# Patient Record
Sex: Female | Born: 2005 | Race: Black or African American | Hispanic: No | Marital: Single | Smoking: Never smoker
Health system: Southern US, Community
[De-identification: ages and names within clinical notes are randomized; demographics above are authoritative.]

## PROBLEM LIST (undated history)

## (undated) DIAGNOSIS — F419 Anxiety disorder, unspecified: Secondary | ICD-10-CM

## (undated) HISTORY — PX: OTHER SURGICAL HISTORY: SHX169

---
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
## 2024-03-09 ENCOUNTER — Encounter: Payer: Self-pay | Admitting: Emergency Medicine

## 2024-03-09 ENCOUNTER — Other Ambulatory Visit: Payer: Self-pay

## 2024-03-09 ENCOUNTER — Ambulatory Visit
Admission: EM | Admit: 2024-03-09 | Discharge: 2024-03-09 | Disposition: A | Discharge status: Home / Self Care | Source: Home / Self Care | Attending: Family Medicine | Admitting: Family Medicine

## 2024-03-09 DIAGNOSIS — J069 Acute upper respiratory infection, unspecified: Secondary | ICD-10-CM | POA: Diagnosis not present

## 2024-03-09 LAB — POC COVID19/FLU A&B COMBO
Covid Antigen, POC: NEGATIVE
Influenza A Antigen, POC: NEGATIVE
Influenza B Antigen, POC: NEGATIVE

## 2024-03-09 MED ORDER — ONDANSETRON 4 MG PO TBDP: 4.0000 mg | ORAL_TABLET | Freq: Three times a day (TID) | ORAL | 0 refills | Status: AC | PRN

## 2024-03-09 MED ORDER — BENZONATATE 100 MG PO CAPS: 100.0000 mg | ORAL_CAPSULE | Freq: Three times a day (TID) | ORAL | 0 refills | Status: AC | PRN

## 2024-03-09 NOTE — Discharge Instructions (Signed)
 The testing for flu and COVID was negative.  This is most likely some other viral illness causing your symptoms.  Take benzonatate  100 mg, 1 tab every 8 hours as needed for cough.  Ondansetron  dissolved in the mouth every 8 hours as needed for nausea or vomiting. Clear liquids(water, gatorade/pedialyte, ginger ale/sprite, chicken broth/soup) and bland things(crackers/toast, rice, potato, bananas) to eat. Avoid acidic foods like lemon/lime/orange/tomato, and avoid greasy/spicy foods.

## 2024-03-09 NOTE — ED Provider Notes (Signed)
 [facility]    CSN: [id number] Arrival date & time: 03/09/24  1522      History   Chief Complaint Chief Complaint  Patient presents with   Cough    HPI Ruth Diaz is a 18 y.o. female.    Cough  Here for cough and nasal congestion and rhinorrhea that began this morning.  She also had nausea and vomiting today, about 5 times this morning.  She is now distal nauseated and has not thrown up in several hours.  No diarrhea.  NKDA  Last menstrual cycle was December 2.  She has been exposed to her nephew who has similar symptoms. Past Medical History:  Diagnosis Date   Anxiety     Patient Active Problem List   Diagnosis Date Noted   Dizziness    Acute maxillary sinusitis    Emesis, persistent 02/13/2015   Vertigo 02/13/2015   Dehydration     Past Surgical History:  Procedure Laterality Date   Strabismus correction surgery      OB History   No obstetric history on file.      Home Medications    Prior to Admission medications  Medication Sig Start Date End Date Taking? Authorizing Provider  benzonatate  (TESSALON ) 100 MG capsule Take 1 capsule (100 mg total) by mouth 3 (three) times daily as needed for cough. 03/09/24  Yes Vonna Sharlet POUR, MD  ondansetron  (ZOFRAN -ODT) 4 MG disintegrating tablet Take 1 tablet (4 mg total) by mouth every 8 (eight) hours as needed for nausea or vomiting. 03/09/24  Yes Vonna Sharlet POUR, MD  cetirizine  (ZYRTEC  ALLERGY) 10 MG tablet Take 1 tablet (10 mg total) by mouth at bedtime. 03/04/22 08/15/23  Joesph Shaver Scales, PA-C  ciprofloxacin  (CIPRO ) 500 MG tablet Take 1 tablet (500 mg total) by mouth every 12 (twelve) hours. 08/24/23   Keith, Kayla N, PA-C  dicyclomine  (BENTYL ) 20 MG tablet Take 1 tablet (20 mg total) by mouth 2 (two) times daily. 08/24/23   Keith, Kayla N, PA-C  Fluocinolone  Acetonide (DERMOTIC ) 0.01 % OIL Place 5 drops in ear(s) 2 (two) times daily as needed (Itching in ears). 03/04/22   Joesph Shaver Scales, PA-C  fluticasone  (FLONASE ) 50 MCG/ACT nasal spray Place 1 spray into both nostrils daily. Patient not taking: Reported on 08/15/2023 03/04/22   Joesph Shaver Scales, PA-C    Family History Family History  Problem Relation Age of Onset   Cancer Mother    Brain cancer Mother    Asthma Sister    Diabetes Maternal Aunt    Diabetes Maternal Grandmother    Hypertension Maternal Grandmother    Hypertension Paternal Grandmother    Heart disease Paternal Grandmother     Social History Social History[1]   Allergies   Patient has no known allergies.   Review of Systems Review of Systems  Respiratory:  Positive for cough.      Physical Exam Triage Vital Signs ED Triage Vitals  Encounter Vitals Group     BP 03/09/24 1733 118/82     Girls Systolic BP Percentile --      Girls Diastolic BP Percentile --      Boys Systolic BP Percentile --      Boys Diastolic BP Percentile --      Pulse Rate 03/09/24 1733 (!) 122     Resp 03/09/24 1733 18     Temp 03/09/24 1733 99.8 F (37.7 C)     Temp Source 03/09/24 1733 Oral  SpO2 03/09/24 1733 96 %     Weight --      Height --      Head Circumference --      Peak Flow --      Pain Score 03/09/24 1734 6     Pain Loc --      Pain Education --      Exclude from Growth Chart --    No data found.  Updated Vital Signs BP 118/82 (BP Location: Left Arm)   Pulse (!) 122   Temp 99.8 F (37.7 C) (Oral)   Resp 18   LMP 02/24/2024   SpO2 96%   Visual Acuity Right Eye Distance:   Left Eye Distance:   Bilateral Distance:    Right Eye Near:   Left Eye Near:    Bilateral Near:     Physical Exam Vitals reviewed.  Constitutional:      General: She is not in acute distress.    Appearance: She is not ill-appearing, toxic-appearing or diaphoretic.  HENT:     Right Ear: Tympanic membrane and ear canal normal.     Left Ear: Tympanic membrane and ear canal normal.     Nose: Congestion present.     Mouth/Throat:      Mouth: Mucous membranes are moist.     Comments: There is mild erythema of the tonsillar pillars. Eyes:     Extraocular Movements: Extraocular movements intact.     Conjunctiva/sclera: Conjunctivae normal.     Pupils: Pupils are equal, round, and reactive to light.  Cardiovascular:     Rate and Rhythm: Normal rate and regular rhythm.     Heart sounds: No murmur heard. Pulmonary:     Effort: Pulmonary effort is normal. No respiratory distress.     Breath sounds: No stridor. No wheezing, rhonchi or rales.  Musculoskeletal:     Cervical back: Neck supple.  Lymphadenopathy:     Cervical: No cervical adenopathy.  Skin:    Capillary Refill: Capillary refill takes less than 2 seconds.     Coloration: Skin is not jaundiced or pale.  Neurological:     General: No focal deficit present.     Mental Status: She is alert and oriented to person, place, and time.  Psychiatric:        Behavior: Behavior normal.      UC Treatments / Results  Labs (all labs ordered are listed, but only abnormal results are displayed) Labs Reviewed  POC COVID19/FLU A&B COMBO - Normal    EKG   Radiology No results found.  Procedures Procedures (including critical care time)  Medications Ordered in UC Medications - No data to display  Initial Impression / Assessment and Plan / UC Course  I have reviewed the triage vital signs and the nursing notes.  Pertinent labs & imaging results that were available during my care of the patient were reviewed by me and considered in my medical decision making (see chart for details).     Testing for flu and COVID is negative.  Ondansetron  is sent in for her symptoms along with some Tessalon  Perles for cough. Final Clinical Impressions(s) / UC Diagnoses   Final diagnoses:  Viral URI     Discharge Instructions      The testing for flu and COVID was negative.  This is most likely some other viral illness causing your symptoms.  Take benzonatate  100 mg,  1 tab every 8 hours as needed for cough.  Ondansetron  dissolved in the  mouth every 8 hours as needed for nausea or vomiting. Clear liquids(water, gatorade/pedialyte, ginger ale/sprite, chicken broth/soup) and bland things(crackers/toast, rice, potato, bananas) to eat. Avoid acidic foods like lemon/lime/orange/tomato, and avoid greasy/spicy foods.       ED Prescriptions     Medication Sig Dispense Auth. Provider   ondansetron  (ZOFRAN -ODT) 4 MG disintegrating tablet Take 1 tablet (4 mg total) by mouth every 8 (eight) hours as needed for nausea or vomiting. 10 tablet Vonna Sharlet POUR, MD   benzonatate  (TESSALON ) 100 MG capsule Take 1 capsule (100 mg total) by mouth 3 (three) times daily as needed for cough. 21 capsule Omari Mcmanaway K, MD      PDMP not reviewed this encounter.     [1]  Social History Tobacco Use   Smoking status: Never    Passive exposure: Never   Smokeless tobacco: Never  Vaping Use   Vaping status: Never Used  Substance Use Topics   Alcohol use: Not Currently   Drug use: Not Currently     Vonna Sharlet POUR, MD 03/09/24 1811

## 2024-03-09 NOTE — ED Triage Notes (Signed)
 Pt sts cough and body aches x 2 days; pt reports person in home with flu

## 2024-03-10 ENCOUNTER — Other Ambulatory Visit: Payer: Self-pay

## 2024-03-10 ENCOUNTER — Encounter (HOSPITAL_COMMUNITY): Payer: Self-pay

## 2024-03-10 ENCOUNTER — Emergency Department (HOSPITAL_COMMUNITY)
Admission: EM | Admit: 2024-03-10 | Discharge: 2024-03-10 | Discharge status: Against Medical Advice | Source: Home / Self Care

## 2024-03-10 DIAGNOSIS — Z5321 Procedure and treatment not carried out due to patient leaving prior to being seen by health care provider: Secondary | ICD-10-CM | POA: Insufficient documentation

## 2024-03-10 DIAGNOSIS — R059 Cough, unspecified: Secondary | ICD-10-CM | POA: Diagnosis present

## 2024-03-10 DIAGNOSIS — M791 Myalgia, unspecified site: Secondary | ICD-10-CM | POA: Diagnosis not present

## 2024-03-10 MED ORDER — ACETAMINOPHEN 325 MG PO TABS
650.0000 mg | ORAL_TABLET | Freq: Once | ORAL | Status: AC
  Administered 2024-03-10: 02:00:00 650 mg via ORAL
  Filled 2024-03-10: qty 2

## 2024-03-10 NOTE — ED Triage Notes (Signed)
 Pt reports with cough and generalized body aches for 2 days. Pt recently seen at [facility].

## 2024-03-12 ENCOUNTER — Emergency Department (HOSPITAL_COMMUNITY)
Admission: EM | Admit: 2024-03-12 | Discharge: 2024-03-12 | Disposition: A | Discharge status: Home / Self Care | Attending: Emergency Medicine | Admitting: Emergency Medicine

## 2024-03-12 ENCOUNTER — Encounter (HOSPITAL_COMMUNITY): Payer: Self-pay

## 2024-03-12 ENCOUNTER — Other Ambulatory Visit: Payer: Self-pay

## 2024-03-12 ENCOUNTER — Emergency Department (HOSPITAL_COMMUNITY)

## 2024-03-12 DIAGNOSIS — R1013 Epigastric pain: Secondary | ICD-10-CM | POA: Insufficient documentation

## 2024-03-12 DIAGNOSIS — R000 Tachycardia, unspecified: Secondary | ICD-10-CM | POA: Diagnosis not present

## 2024-03-12 DIAGNOSIS — R509 Fever, unspecified: Secondary | ICD-10-CM | POA: Diagnosis not present

## 2024-03-12 DIAGNOSIS — R110 Nausea: Secondary | ICD-10-CM

## 2024-03-12 DIAGNOSIS — R748 Abnormal levels of other serum enzymes: Secondary | ICD-10-CM | POA: Diagnosis not present

## 2024-03-12 DIAGNOSIS — R112 Nausea with vomiting, unspecified: Secondary | ICD-10-CM | POA: Insufficient documentation

## 2024-03-12 DIAGNOSIS — J101 Influenza due to other identified influenza virus with other respiratory manifestations: Secondary | ICD-10-CM

## 2024-03-12 DIAGNOSIS — R0981 Nasal congestion: Secondary | ICD-10-CM | POA: Insufficient documentation

## 2024-03-12 DIAGNOSIS — R059 Cough, unspecified: Secondary | ICD-10-CM | POA: Insufficient documentation

## 2024-03-12 LAB — CBC
HCT: 36.9 % (ref 36.0–46.0)
Hemoglobin: 11.8 g/dL — ABNORMAL LOW (ref 12.0–15.0)
MCH: 23.6 pg — ABNORMAL LOW (ref 26.0–34.0)
MCHC: 32 g/dL (ref 30.0–36.0)
MCV: 73.8 fL — ABNORMAL LOW (ref 80.0–100.0)
Platelets: 243 K/uL (ref 150–400)
RBC: 5 MIL/uL (ref 3.87–5.11)
RDW: 17.3 % — ABNORMAL HIGH (ref 11.5–15.5)
WBC: 2.3 K/uL — ABNORMAL LOW (ref 4.0–10.5)
nRBC: 0 % (ref 0.0–0.2)

## 2024-03-12 LAB — COMPREHENSIVE METABOLIC PANEL WITH GFR
ALT: 12 U/L (ref 0–44)
AST: 33 U/L (ref 15–41)
Albumin: 4.7 g/dL (ref 3.5–5.0)
Alkaline Phosphatase: 58 U/L (ref 38–126)
Anion gap: 12 (ref 5–15)
BUN: 6 mg/dL (ref 6–20)
CO2: 28 mmol/L (ref 22–32)
Calcium: 9.9 mg/dL (ref 8.9–10.3)
Chloride: 99 mmol/L (ref 98–111)
Creatinine, Ser: 0.73 mg/dL (ref 0.44–1.00)
GFR, Estimated: >60 mL/min
Glucose, Bld: 95 mg/dL (ref 70–99)
Potassium: 3.6 mmol/L (ref 3.5–5.1)
Sodium: 139 mmol/L (ref 135–145)
Total Bilirubin: 0.3 mg/dL (ref 0.0–1.2)
Total Protein: 7.8 g/dL (ref 6.5–8.1)

## 2024-03-12 LAB — RESP PANEL BY RT-PCR (RSV, FLU A&B, COVID)  RVPGX2
Influenza A by PCR: POSITIVE — AB
Influenza B by PCR: NEGATIVE
Resp Syncytial Virus by PCR: NEGATIVE
SARS Coronavirus 2 by RT PCR: NEGATIVE

## 2024-03-12 LAB — URINALYSIS, ROUTINE W REFLEX MICROSCOPIC
Bilirubin Urine: NEGATIVE
Glucose, UA: NEGATIVE mg/dL
Hgb urine dipstick: NEGATIVE
Ketones, ur: 20 mg/dL — AB
Nitrite: NEGATIVE
Protein, ur: 30 mg/dL — AB
Specific Gravity, Urine: 1.023 (ref 1.005–1.030)
pH: 5 (ref 5.0–8.0)

## 2024-03-12 LAB — HCG, SERUM, QUALITATIVE: Preg, Serum: NEGATIVE

## 2024-03-12 LAB — LIPASE, BLOOD: Lipase: 111 U/L — ABNORMAL HIGH (ref 11–51)

## 2024-03-12 MED ORDER — ACETAMINOPHEN 500 MG PO TABS
1000.0000 mg | ORAL_TABLET | Freq: Once | ORAL | Status: AC
  Administered 2024-03-12: 1000 mg via ORAL
  Filled 2024-03-12: qty 2

## 2024-03-12 MED ORDER — IBUPROFEN 200 MG PO TABS
600.0000 mg | ORAL_TABLET | Freq: Once | ORAL | Status: AC
  Administered 2024-03-12: 600 mg via ORAL
  Filled 2024-03-12: qty 3

## 2024-03-12 MED ORDER — ONDANSETRON 8 MG PO TBDP
8.0000 mg | ORAL_TABLET | Freq: Once | ORAL | Status: AC
  Administered 2024-03-12: 8 mg via ORAL
  Filled 2024-03-12: qty 1

## 2024-03-12 NOTE — ED Triage Notes (Signed)
 Patient said her rib cage hurts, nauseous, and lower back hurts. Whole body is aching. Has been vomiting. No diarrhea.

## 2024-03-12 NOTE — ED Provider Notes (Signed)
 " [place] EMERGENCY DEPARTMENT AT [facility] Provider Note   CSN: [id number] Arrival date & time: 03/12/24  1451     Patient presents with: Nausea   Ruth Diaz is a 18 y.o. female.   Patient is a 18 year old female with no significant past medical history presenting to the emergency department with nausea, cough and bodyaches.  She states that her symptoms started on Monday and she was seen at urgent care and tested for COVID, flu and RSV and that her test came back negative at that time.  She was given a prescription for Zofran  and Tessalon  Perles we only picked up the Tessalon  prescription.  She states that throughout the week she has been feeling worse with nausea and vomiting, diffuse bodyaches, cough, congestion and sore throat.  She denies any chest pain or shortness of breath.  She states that she has been feeling fevers.  She states that she is having some epigastric abdominal discomfort.  She denies any diarrhea, dysuria or hematuria.  She denies any known sick contacts.  She states she has been taking Tylenol  and Motrin  for her pain at home.  The history is provided by the patient and a parent.       Prior to Admission medications  Medication Sig Start Date End Date Taking? Authorizing Provider  benzonatate  (TESSALON ) 100 MG capsule Take 1 capsule (100 mg total) by mouth 3 (three) times daily as needed for cough. 03/09/24   Vonna Sharlet POUR, MD  cetirizine  (ZYRTEC  ALLERGY) 10 MG tablet Take 1 tablet (10 mg total) by mouth at bedtime. 03/04/22 08/15/23  Joesph Shaver Scales, PA-C  ciprofloxacin  (CIPRO ) 500 MG tablet Take 1 tablet (500 mg total) by mouth every 12 (twelve) hours. 08/24/23   Keith, Kayla N, PA-C  dicyclomine  (BENTYL ) 20 MG tablet Take 1 tablet (20 mg total) by mouth 2 (two) times daily. 08/24/23   Keith, Kayla N, PA-C  Fluocinolone  Acetonide (DERMOTIC ) 0.01 % OIL Place 5 drops in ear(s) 2 (two) times daily as needed (Itching in ears). 03/04/22    Joesph Shaver Scales, PA-C  fluticasone  (FLONASE ) 50 MCG/ACT nasal spray Place 1 spray into both nostrils daily. Patient not taking: Reported on 08/15/2023 03/04/22   Joesph Shaver Scales, PA-C  ondansetron  (ZOFRAN -ODT) 4 MG disintegrating tablet Take 1 tablet (4 mg total) by mouth every 8 (eight) hours as needed for nausea or vomiting. 03/09/24   Vonna Sharlet POUR, MD    Allergies: Patient has no known allergies.    Review of Systems  Updated Vital Signs BP 109/87   Pulse (!) 109   Temp 98.4 F (36.9 C) (Oral)   Resp 18   Ht 4' 11 (1.499 m)   Wt 49 kg   LMP 02/24/2024   SpO2 99%   BMI 21.81 kg/m   Physical Exam Vitals and nursing note reviewed.  Constitutional:      General: She is not in acute distress.    Appearance: Normal appearance.  HENT:     Head: Normocephalic and atraumatic.     Nose: Nose normal.     Mouth/Throat:     Mouth: Mucous membranes are moist.     Pharynx: Oropharynx is clear.  Eyes:     Extraocular Movements: Extraocular movements intact.     Conjunctiva/sclera: Conjunctivae normal.  Cardiovascular:     Rate and Rhythm: Regular rhythm. Tachycardia present.     Heart sounds: Normal heart sounds.  Pulmonary:     Effort: Pulmonary effort is  normal.     Breath sounds: Normal breath sounds.  Abdominal:     General: Abdomen is flat.     Palpations: Abdomen is soft.     Tenderness: There is abdominal tenderness (minimal epigastric). There is no guarding or rebound.  Musculoskeletal:        General: Normal range of motion.     Cervical back: Normal range of motion.  Skin:    General: Skin is warm and dry.  Neurological:     General: No focal deficit present.     Mental Status: She is alert and oriented to person, place, and time.  Psychiatric:        Mood and Affect: Mood normal.        Behavior: Behavior normal.     (all labs ordered are listed, but only abnormal results are displayed) Labs Reviewed  RESP PANEL BY RT-PCR (RSV, FLU A&B,  COVID)  RVPGX2 - Abnormal; Notable for the following components:      Result Value   Influenza A by PCR POSITIVE (*)    All other components within normal limits  LIPASE, BLOOD - Abnormal; Notable for the following components:   Lipase 111 (*)    All other components within normal limits  CBC - Abnormal; Notable for the following components:   WBC 2.3 (*)    Hemoglobin 11.8 (*)    MCV 73.8 (*)    MCH 23.6 (*)    RDW 17.3 (*)    All other components within normal limits  URINALYSIS, ROUTINE W REFLEX MICROSCOPIC - Abnormal; Notable for the following components:   Color, Urine AMBER (*)    APPearance TURBID (*)    Ketones, ur 20 (*)    Protein, ur 30 (*)    Leukocytes,Ua TRACE (*)    Bacteria, UA FEW (*)    All other components within normal limits  COMPREHENSIVE METABOLIC PANEL WITH GFR  HCG, SERUM, QUALITATIVE    EKG: None  Radiology: DG Chest 2 View Result Date: 03/12/2024 EXAM: 2 VIEW(S) XRAY OF THE CHEST 03/12/2024 03:11:00 PM COMPARISON: 07/12/2022 CLINICAL HISTORY: rib pain FINDINGS: LUNGS AND PLEURA: No focal pulmonary opacity. No pleural effusion. No pneumothorax. HEART AND MEDIASTINUM: No acute abnormality of the cardiac and mediastinal silhouettes. BONES AND SOFT TISSUES: No acute osseous abnormality. IMPRESSION: 1. No acute process. Electronically signed by: Rockey Kilts MD 03/12/2024 04:05 PM EST RP Workstation: [id number]     Procedures   Medications Ordered in the ED  acetaminophen  (TYLENOL ) tablet 1,000 mg (has no administration in time range)  ibuprofen  (ADVIL ) tablet 600 mg (has no administration in time range)  ondansetron  (ZOFRAN -ODT) disintegrating tablet 8 mg (has no administration in time range)                                    Medical Decision Making This patient presents to the ED with chief complaint(s) of nausea, cough, body aches with no pertinent past medical history which further complicates the presenting complaint. The complaint involves  an extensive differential diagnosis and also carries with it a high risk of complications and morbidity.    The differential diagnosis includes dehydration, electrolyte abnormality, viral syndrome, gastritis, GERD, pancreatitis, hepatitis, gastroenteritis  Additional history obtained: Additional history obtained from family Records reviewed urgent care records  ED Course and Reassessment: On patient's arrival she was mildly tachycardic otherwise hemodynamically stable in no acute distress.  She is  initially evaluated in triage and had labs, viral swab and chest x-ray performed.  The patient is flu A+.  CBC is at her baseline.  She does have a mildly elevated lipase that is not 3 times upper limit of normal making pancreatitis unlikely and more likely to be reactionary to her viral syndrome.  UA shows some mild dehydration but no signs of UTI.  The patient will be given symptomatic treatment, she is tolerating p.o.  Was recommended outpatient follow-up and was given strict return precautions.  Independent labs interpretation:  The following labs were independently interpreted: Flu a positive, mildly elevated lipase, labs otherwise at baseline  Independent visualization of imaging: - I independently visualized the following imaging with scope of interpretation limited to determining acute life threatening conditions related to emergency care: Chest x-ray, which revealed no acute disease  Consultation: - Consulted or discussed management/test interpretation w/ external professional: N/A  Consideration for admission or further workup: Patient has no emergent conditions requiring admission or further work-up at this time and is stable for discharge home with primary care follow-up  Social Determinants of health: N/A    Amount and/or Complexity of Data Reviewed Labs: ordered. Radiology: ordered.  Risk OTC drugs. Prescription drug management.       Final diagnoses:  None    ED  Discharge Orders     None          Kingsley, Eleanor Gatliff K, DO 03/12/24 1758  "

## 2024-03-12 NOTE — Discharge Instructions (Signed)
 You were seen in the emergency department for your nausea, cough and congestion.  You tested positive for Influenza A.  You did not require treatment with any antiviral medications can continue symptomatic treatment with Tylenol  and Motrin  as needed for fevers and bodyaches and both can be taken up to every 6 hours.  You can take Zofran  as needed for nausea.  You can try Mucinex  or raw honey as needed for your cough and you can use a humidifier or hot steam from the shower to help with your congestion as well as over-the-counter and nasal decongestant sprays.  You can follow-up with your primary doctor in the next few days to have your symptoms rechecked.  You should return to the emergency department for significantly worsening shortness of breath, severe chest pain, repetitive vomiting or if you have any other new or concerning symptoms.

## 2024-03-12 NOTE — ED Notes (Signed)
Patient given cup for urine specimen.
